# Patient Record
Sex: Male | Born: 1935 | Race: White | Hispanic: No | Smoking: Never smoker
Health system: Southern US, Community
[De-identification: ages and names within clinical notes are randomized; demographics above are authoritative.]

## PROBLEM LIST (undated history)

## (undated) DIAGNOSIS — IMO0002 Reserved for concepts with insufficient information to code with codable children: Secondary | ICD-10-CM

## (undated) DIAGNOSIS — M1990 Unspecified osteoarthritis, unspecified site: Secondary | ICD-10-CM

## (undated) DIAGNOSIS — I10 Essential (primary) hypertension: Secondary | ICD-10-CM

## (undated) DIAGNOSIS — C801 Malignant (primary) neoplasm, unspecified: Secondary | ICD-10-CM

## (undated) DIAGNOSIS — H9190 Unspecified hearing loss, unspecified ear: Secondary | ICD-10-CM

## (undated) DIAGNOSIS — M810 Age-related osteoporosis without current pathological fracture: Secondary | ICD-10-CM

## (undated) DIAGNOSIS — R233 Spontaneous ecchymoses: Secondary | ICD-10-CM

## (undated) DIAGNOSIS — R238 Other skin changes: Secondary | ICD-10-CM

## (undated) DIAGNOSIS — R0989 Other specified symptoms and signs involving the circulatory and respiratory systems: Secondary | ICD-10-CM

## (undated) DIAGNOSIS — R609 Edema, unspecified: Secondary | ICD-10-CM

## (undated) HISTORY — PX: BACK SURGERY: SHX140

## (undated) HISTORY — PX: KNEE SURGERY: SHX244

## (undated) HISTORY — DX: Spontaneous ecchymoses: R23.3

## (undated) HISTORY — DX: Reserved for concepts with insufficient information to code with codable children: IMO0002

## (undated) HISTORY — DX: Other specified symptoms and signs involving the circulatory and respiratory systems: R09.89

## (undated) HISTORY — DX: Other skin changes: R23.8

## (undated) HISTORY — PX: OTHER SURGICAL HISTORY: SHX169

## (undated) HISTORY — DX: Unspecified hearing loss, unspecified ear: H91.90

## (undated) HISTORY — DX: Essential (primary) hypertension: I10

## (undated) HISTORY — DX: Edema, unspecified: R60.9

## (undated) HISTORY — PX: APPENDECTOMY: SHX54

## (undated) HISTORY — PX: NECK SURGERY: SHX720

## (undated) HISTORY — DX: Unspecified osteoarthritis, unspecified site: M19.90

## (undated) HISTORY — DX: Malignant (primary) neoplasm, unspecified: C80.1

## (undated) HISTORY — DX: Age-related osteoporosis without current pathological fracture: M81.0

---

## 2006-07-11 ENCOUNTER — Inpatient Hospital Stay (HOSPITAL_COMMUNITY): Admission: RE | Admit: 2006-07-11 | Discharge: 2006-07-16 | Payer: Self-pay | Admitting: Orthopaedic Surgery

## 2006-07-13 ENCOUNTER — Ambulatory Visit: Payer: Self-pay | Admitting: Physical Medicine & Rehabilitation

## 2007-01-25 ENCOUNTER — Encounter: Admission: RE | Admit: 2007-01-25 | Discharge: 2007-01-25 | Payer: Self-pay | Admitting: Orthopaedic Surgery

## 2007-06-04 ENCOUNTER — Encounter: Admission: RE | Admit: 2007-06-04 | Discharge: 2007-06-04 | Payer: Self-pay | Admitting: Orthopaedic Surgery

## 2007-11-05 IMAGING — CR DG LUMBAR SPINE 2-3V
1 series · 1 of 1 positions shown · non-contrast
Comparison: none

CLINICAL DATA: L2-L5 fusion.  
 LUMBAR SPINE ? 3 VIEW:

[view not recorded]
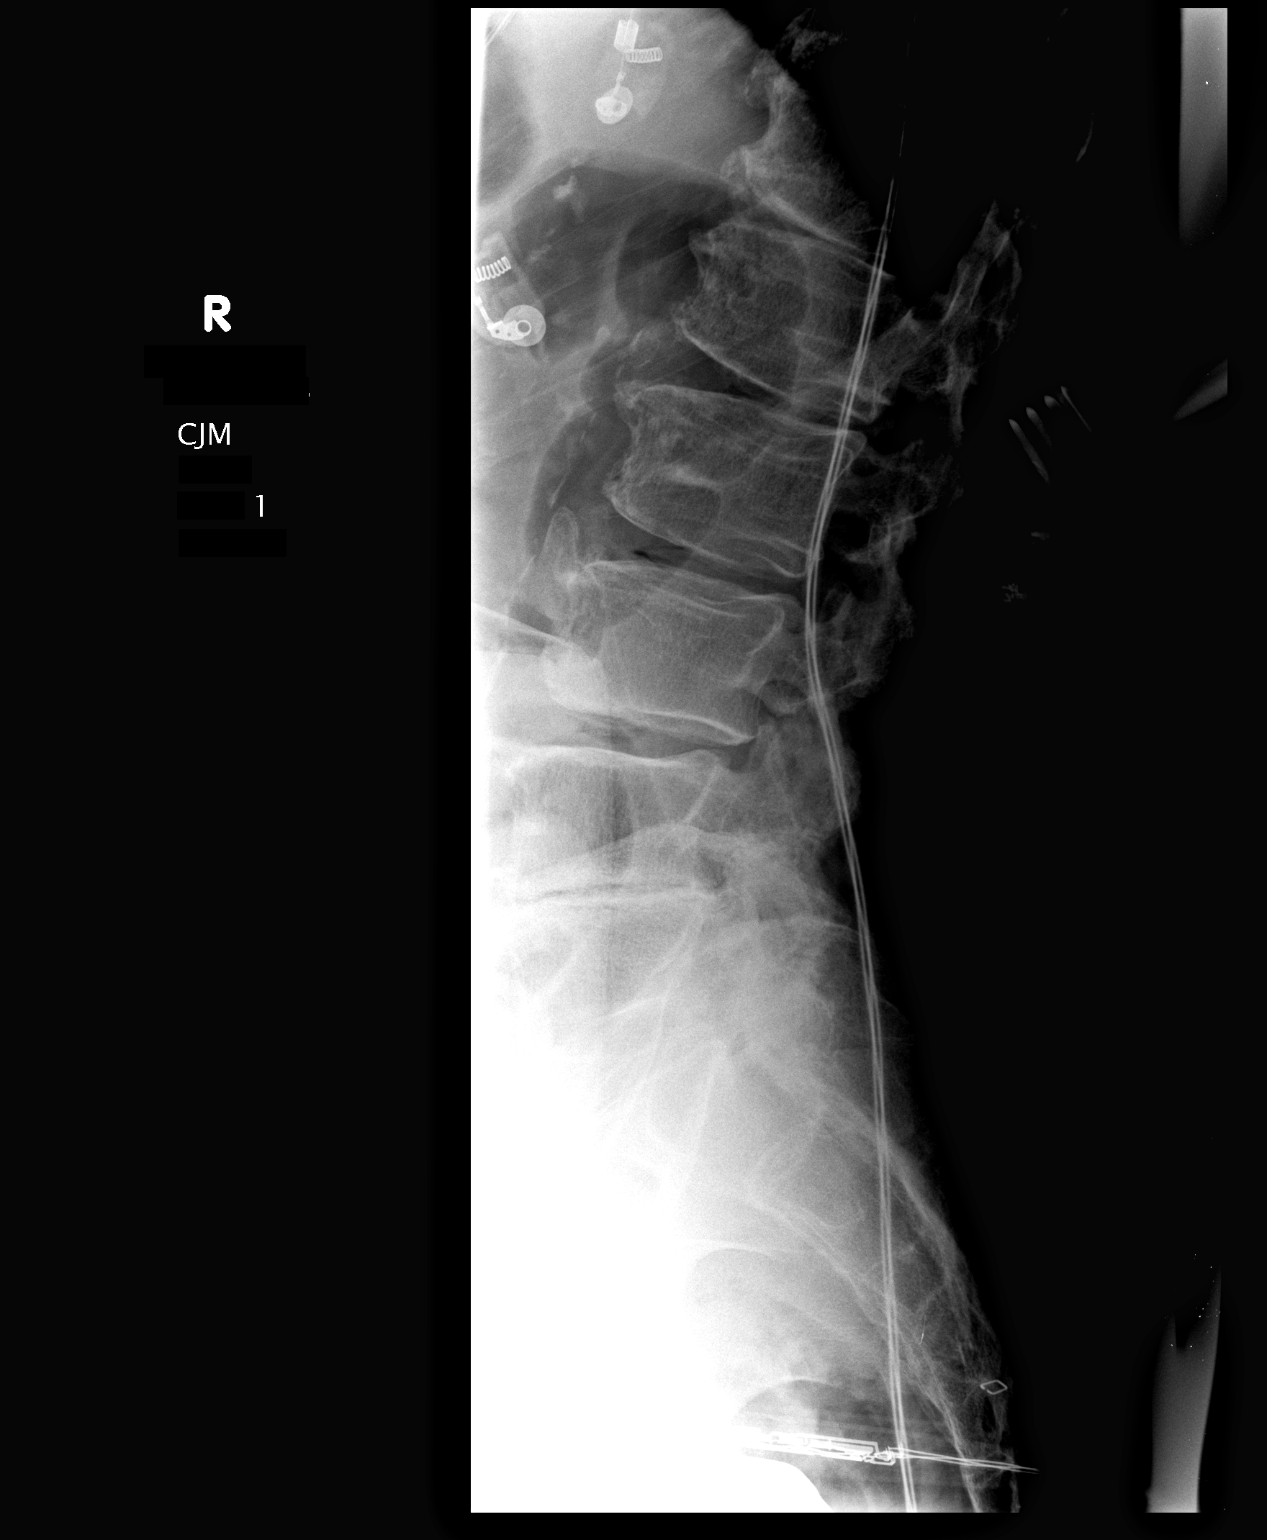

[1 of 1 positions shown; findings below may reference images not displayed]

FINDINGS: Film #1 at 6377 hours demonstrates spreaders posteriorly in the region of L1 and L2.  
 Film #2 at 4440 hours demonstrates spreaders posteriorly in the wound from L2 to L5.  There is a probe directed much closer toward the L2 vertebra. 
 Film #3 at 2522 hours demonstrates a pedicle screw and rod fixation from L2 through L5.  Alignment appears grossly satisfactory.
IMPRESSION: L1 and L2 identified.    L2 marked.    L2-L5 fusion.

## 2008-10-15 ENCOUNTER — Ambulatory Visit (HOSPITAL_COMMUNITY): Admission: RE | Admit: 2008-10-15 | Discharge: 2008-10-15 | Payer: Self-pay | Admitting: Anesthesiology

## 2010-02-09 IMAGING — CR DG SHOULDER 2+V*L*
4 series · 4 of 4 positions shown · non-contrast
Comparison: None

CLINICAL DATA: Left shoulder pain

LEFT SHOULDER - 2+ VIEW

[w shoulder ap external left]
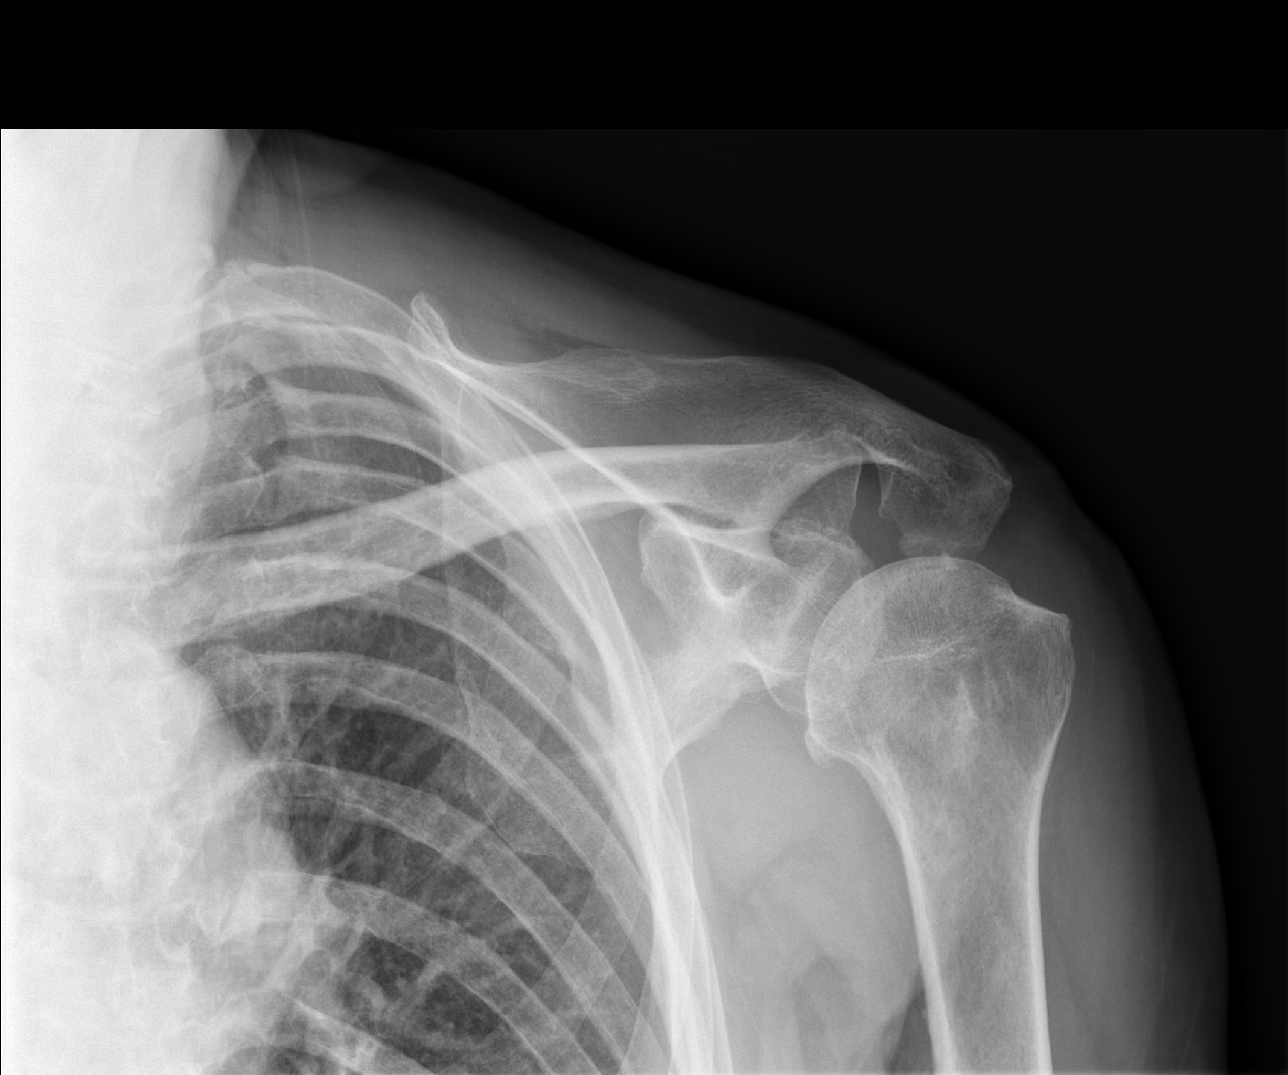

[w shoulder ap internal left]
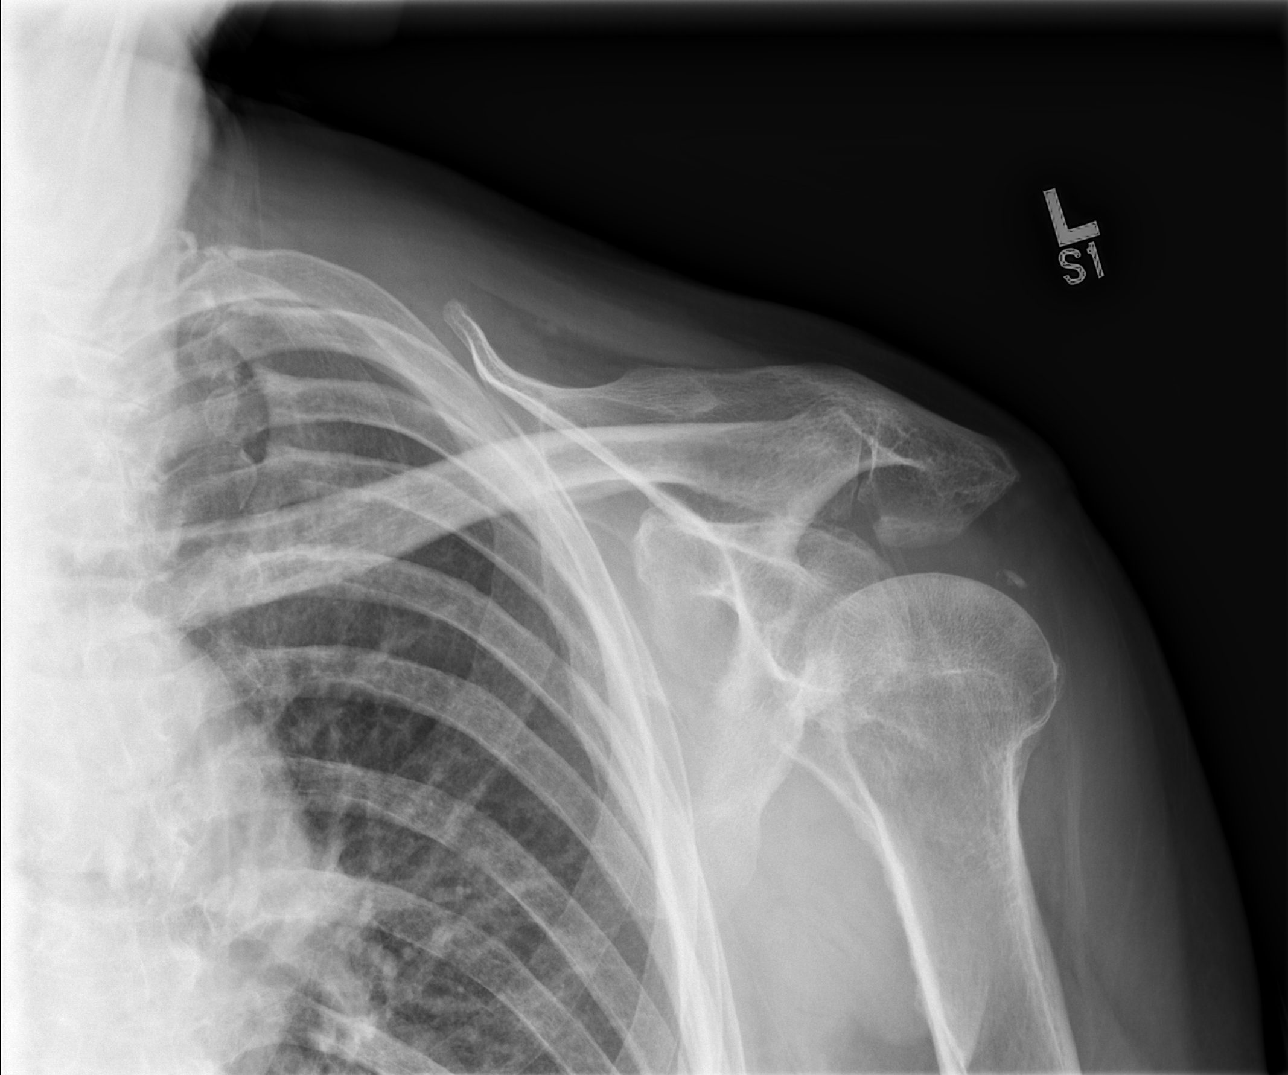

[w shoulder y view left (1 of 2)]
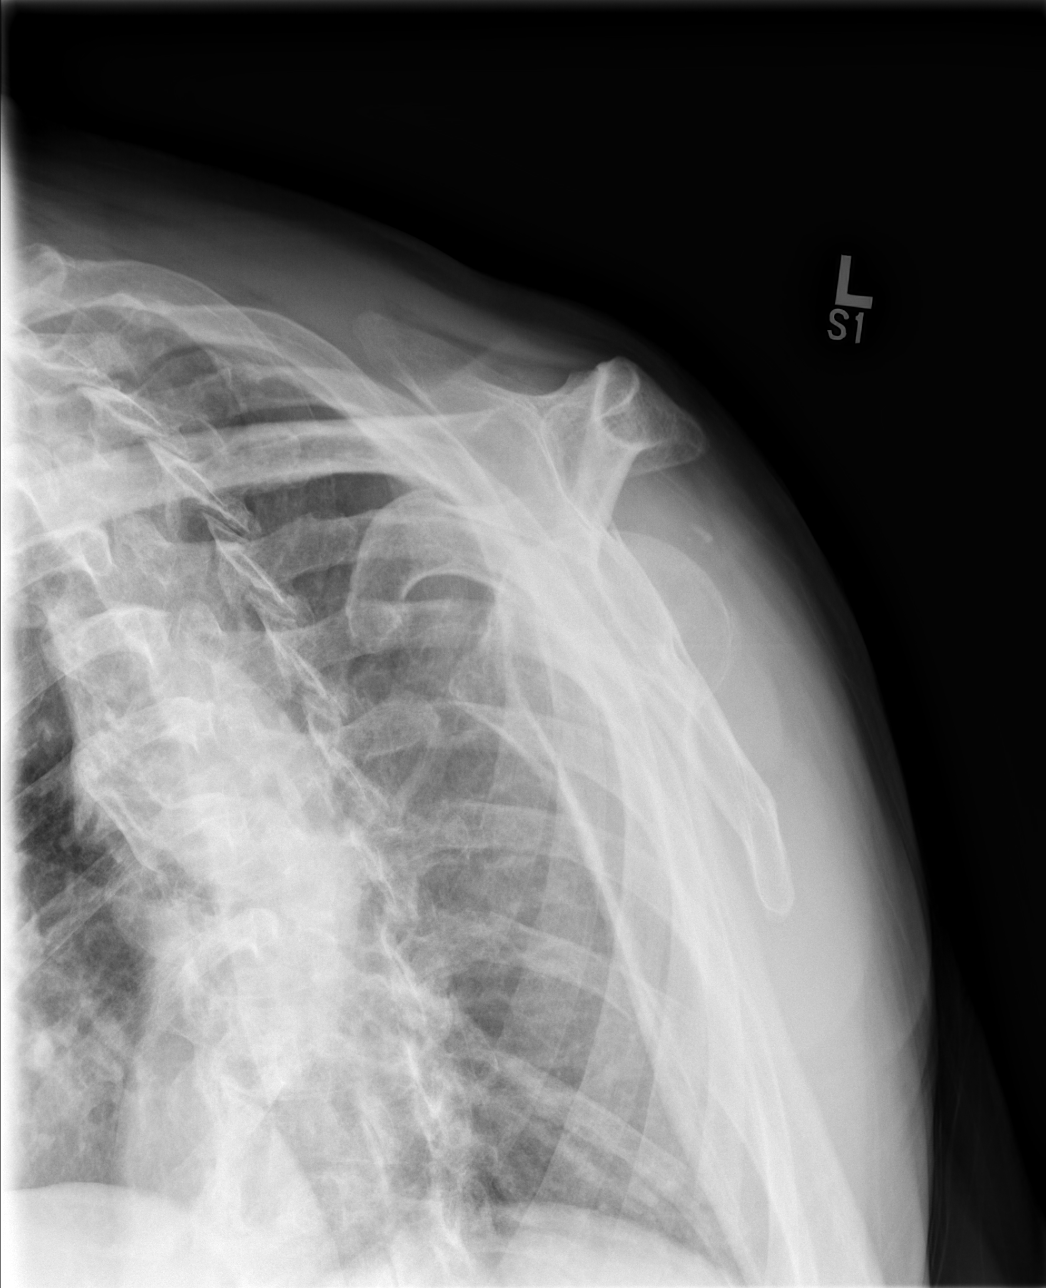

[w shoulder y view left (2 of 2)]
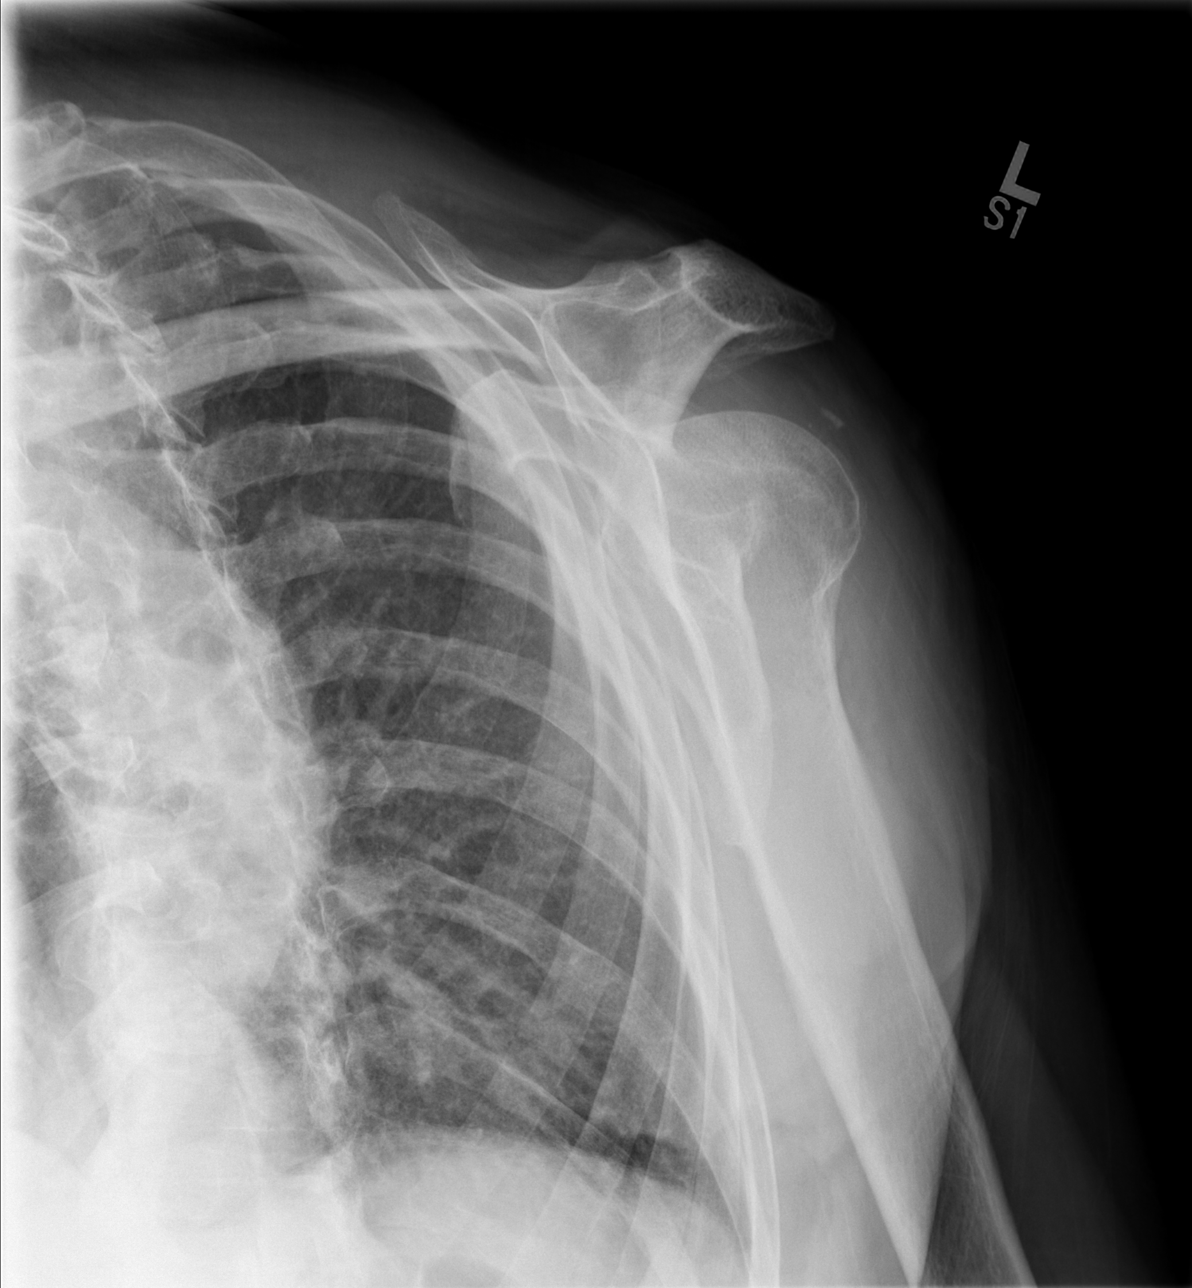

[4 of 4 positions shown; findings below may reference images not displayed]

FINDINGS: Exaggerated anterior angulation of the shoulder during
imaging may be due to thoracic kyphosis. Glenohumeral spurring is
present with osteophyte both along the bony glenoid and humeral
head.

The internal rotation view demonstrates a calcific density
projecting over the rotator cuff possibly representing calcific
tendinopathy.  This is in the vicinity of the infraspinatus.

No dislocation or fracture identified.  The acromial undersurface
thought to be type 2 (curved). Remote, healed left fifth rib
fracture noted.
IMPRESSION: 1.  Degenerative glenohumeral arthropathy.
2.  Suspected calcific tendinopathy of the infraspinatus.
3.  Exaggerated anterior angulation of the scapula on the frontal
projections is likely due to thoracic kyphosis.

## 2011-02-17 NOTE — Op Note (Signed)
NAMEJALAN, FARISS NO.:  [id number]   MEDICAL RECORD NO.:  [id number]          PATIENT TYPE:  INP   LOCATION:  1610                         FACILITY:  [facility]   PHYSICIAN:  Sharolyn Douglas, M.D.        DATE OF BIRTH:  [birth year]   DATE OF PROCEDURE:  07/11/2006  DATE OF DISCHARGE:                                 OPERATIVE REPORT   DIAGNOSES:  1. Lumbar spinal stenosis, recurrent.  2. Post laminectomy syndrome.  3. Retrolisthesis L3-4.  4. Lumbar spondylosis and degenerative disk disease.   PROCEDURES:  1. Revision lumbar laminectomy L2-L5 with wide decompression of the thecal      sac and L2, L3, L4 and L5 nerve roots bilaterally.  2. Transforaminal lumbar interbody fusion L3-4 on the right side and at L4-      5 on the left side with placement of two PEEK cages.  3. Posterior spinal fusion from L2-L5.  4. Segmental pedicle screw instrumentation L2-L5 using the Abbott spine      system.  5. Local autogenous bone graft supplemented with bone morphogenic protein      and 15 mL Grafton allograft.   SURGEON:  Sharolyn Douglas, MD   ASSISTANT:  Verlin Fester, P.A.   ANESTHESIA:  General endotracheal.   ESTIMATED BLOOD LOSS:  500 mL.   COMPLICATIONS:  None.  Needle and sponge count correct.   INDICATIONS:  The patient is a pleasant 77 year old male with chronic  progressive back and bilateral lower extremity pain.  He has had a previous  multilevel lumbar laminectomy without any improvement.  His imaging studies  show severe persistent spinal stenosis in the foramen and lateral recess at  L2-3, L3-4 and L4-5.  He has a retrolisthesis of L3-L4, severe disk space  narrowing at L4-5.  He has failed to respond to other conservative treatment  modalities and due to his severe intractable pain elects to undergo  multilevel lumbar decompression and fusion in hopes of improving his  symptoms.  The risks, benefits, and alternatives were reviewed with him and  his family and  they have elected to proceed.   PROCEDURE:  After informed consent, the patient was taken to the operating  room.  He underwent general endotracheal anesthesia without difficulty and  given prophylactic IV antibiotics.  Neuro monitoring was established in the  form of lower extremity EMG's and SSEPs.  He was carefully turned prone onto  the [employer] four-poster positioning frame.  All bony prominences padded.  The face and eyes protected at all times.  The back was prepped and draped  in the usual sterile fashion.  His previous midline incision was utilized.   Dissection was carried through the scar tissue and the residual L2 spinous  process was identified.  Using meticulous dissection a subperiosteal  exposure was carried out to the tips of the transverse process of L2, L3, L4  and L5 bilaterally.  Great care was taken not to enter the spinal canal due  to the previous laminectomy.  Intraoperative x-ray was taken to confirm the  levels.  We then placed deep retractors.  We turned our attention to  completing revision laminectomy.  This was very tedious due to the  tremendous scarring which had occurred.  Loupes and headlight magnification  along with curettes were used to dissect the edges of the previous  laminectomy, exposing the underlying thecal sac.   Once this was accomplished a high-speed bur along with a Leksell rongeur and  Kerrison punches were used to widen the laminectomy and extend it up across  the L2-3 segment.  We found severe spinal stenosis and the lateral recess at  each level.  At L3-4 there was a large disk rupture on the right side which  was severely compressing the L3 and L4 nerve roots.  This had become  adherent to the thecal sac.  In order to mobilize the nerve roots we  encountered a small dural rent and this was repaired tightly using two 4-0  Nurolon sutures.  Valsalva showed no leak.   Once we were satisfied with the decompression identifying each nerve  root  including the L2, L3-L4 and L5 bilaterally we turned our attention to  placing pedicle screws and completing the posterior arthrodesis from L2-L5.  Each pedicle was started using the awl.  The pedicle probe was then used to  cannulate the pedicles.  We palpated the pedicles with a ball-tip feeler and  could also palpate from within the spinal canal and there were no breeches.  We placed 6.5 x 15 mm screws at L2, L3 and L4.  We utilized 6.5 x 45 mm  screws in L5.  The screw purchase was adequate.  Each screw was stimulated  using triggered EMG's.  There were no deleterious changes.  Before placing  each screw the transverse process was decorticated using a high-speed bur.  Local bone graft obtained from the laminectomy which had been cleaned and  morselized was packed from the L2-L5 transverse processes at this was  supplemented with 15 mL of Grafton allograft.   At this point we turned our attention to completing transforaminal lumbar  interbody fusions on the right at L3-4 and on the left at L4-5 to further  decompress the foramen by indirect distraction.  Starting at L3-4 the disk  space was entered.  On this side there was a large disk rupture and this was  decompressed back into the interspace and removed with a pituitary.  We then  scraped the cartilaginous endplates with curved curettes across the  contralateral side.  The disk space was dilated up to 9 mm.  We packed the  disk space with BMP sponges and local bone graft.  A 9-mm PEEK cage had been  packed with a BMP sponge and was inserted into the interspace, tamped  anteriorly and across the midline.  This allowed for partial reduction of  the retrolisthesis.  We then performed a similar operation at L4-5 on the  left.  In this case we were able to dilate the disk space up to 7 mm.  We  again placed the PEEK cage measuring 7 mm in height packed with the BMP sponge.  After the TLIF procedures there were no deleterious changes  in the  free running EMG's which were monitored throughout.  We can now easily pass  our probe out the L3-4 and L4-5 foramen bilaterally.  Hemostasis was  achieved.  We placed titanium rods which were bent into lordosis into the  polyaxial screw heads.  The titanium locking caps were applied and  sheared  off.  Hemostasis was again achieved and the wound was irrigated.  A deep  Hemovac drain was left in place.  The deep fascia closed with a running #1  Vicryl suture.  Subcutaneous layer closed with 2-0 Vicryl followed by a  running 3-0 nylon suture on the skin edges.  A sterile dressing applied.  The patient was turned supine, extubated without difficulty and transferred  to recovery in stable condition able to move his upper lower extremities.  It should be noted that my assistant [facility], PA was present  throughout the procedure including during the positioning, the exposure, the  decompression, the instrumentation and fusion.  She also assisted with wound  closure.     Sharolyn Douglas, M.D.  Electronically Signed    MC/MEDQ  D:  07/12/2006  T:  07/13/2006  Job:  [id number]

## 2011-02-17 NOTE — Discharge Summary (Signed)
NAMEMANDELL, PANGBORN                 ACCOUNT NO.:  [id number]   MEDICAL RECORD NO.:  [id number]          PATIENT TYPE:  INP   LOCATION:  5031                         FACILITY:  [facility]   PHYSICIAN:  Verlin Fester, P.A.    DATE OF BIRTH:  22-Mar-1919   DATE OF ADMISSION:  07/11/2006  DATE OF DISCHARGE:  07/16/2006                               DISCHARGE SUMMARY   ADMITTING DIAGNOSES:  1. L2 to L5 spinal stenosis and degenerative disk disease.  2. Hypertension.  3. Hypercholesterolemia.   DISCHARGE DIAGNOSES:  1. Status post revision laminectomy and posterior spinal fusion, L2 to      L5.  2. Postoperative blood loss anemia.   PROCEDURE:  On July 11, 2006, the patient was taken to the operating  room for L2 to L5 laminectomy and posterior spinal fusion.   SURGEON:  Sharolyn Douglas, MD.   ASSISTANT:  Mahar, [place], [place].   ANESTHESIA:  General.   CONSULTS:  [facility].   LABORATORY:  CBC with diff preoperatively was within normal limits with  the exception of RDW 15 and eosinophil of 6.  Postoperatively, H&H were  monitored daily and reached a low of 8.6 and 25.2 on July 15, 2006,  increased the following day up to 10.2 and 30.7.  Metabolic panel preop  was normal, except for AST 40 and ALP of 50.  Basic metabolic panel  monitored postoperatively did show an increasing glucose ranging from  164 to 190.  Calcium remained low at 7.8 to 7.9.  One episode decreased  potassium at 3.4 on July 16, 2006.  UA was negative.  Blood typing  was O-positive.  Antibody screen was negative.  Urine cultures showed no  growth.  EKG on October 4th showed sinus bradycardia, with no previous  tracing, read by Charlton Haws, MD.  X-rays of lumbar spine on October  10th were used intraoperatively for localization.   BRIEF HISTORY:  The patient is an 77 year old male who has had  longstanding problems related to his back with decrease in functioning,  increasing pain.  He has tried  conservative treatments and unfortunately  failed.  He was found to have spinal stenosis and severe degenerative  disk disease from L2 to L5.  Because of his failure to improve as well  as significant findings on MRI and x-ray, it was felt that the best  course of management would be a decompression and fusion.  Risks and  benefits of this procedure was discussed with the patient as well as his  family.  He indicated understanding and opted to proceed.   HOSPITAL COURSE:  On October 10, the patient was admitted to the  hospital and taken to the operating room for the above procedure.  He  tolerated the procedure well without any intraoperative complications,  and he was transferred to the recovery room in stable condition.   Postoperatively, routine orthopedic spine protocol was followed, and he  progressed along well.  Physical therapy and occupational therapy did  work with him on a progressive ambulation program and brace using back  precautions.  He was somewhat slow to progress with physical therapy;  therefore, [facility] consult was ordered.  They thought he  may be an appropriate candidate, but in waiting for a bed, he got to the  point that he was independent and safe with his ambulation and brace  using back precautions.  He did have postoperative anemia that got  severe.  He was transfused 2 units of packed red cells.  He tolerated  the transfusion without any apparent complication, and his hemoglobin  increased, as previously dictated in the lab section.  By July 16, 2006, the patient was doing very well.  He was medically stable and  ready for discharge.   DISCHARGE PLAN:  The patient is an 77 year old male who is status post  L2 to L5 laminectomy and fusion, doing well with activities, daily  ambulation program.  Brace on when he is up.  Back precautions at all  times.  No lifting heavier than 5 pounds.  Daily dressing changes until  drainage stops.  Keep  the incision dry until drainage stops.  Follow up  in 2 weeks postoperatively with Dr. Noel Gerold.   DIET:  Regular diet as tolerated.   MEDICATIONS:  1. Vicodin for pain.  2. Robaxin for muscle spasms.  3. Multivitamin daily.  4. Calcium daily.  5. Laxative as needed.   CONDITION ON DISCHARGE:  Stable, improved.   DISPOSITION:  The patient may discharge to his home with his family's  assistance as well as home health, physical therapy, and occupational  therapy.      Verlin Fester, P.A.     CM/MEDQ  D:  09/06/2006  T:  09/07/2006  Job:  [id number]   cc:   Sharolyn Douglas, M.D.

## 2013-11-26 ENCOUNTER — Telehealth: Payer: Self-pay | Admitting: *Deleted

## 2013-11-26 ENCOUNTER — Ambulatory Visit (INDEPENDENT_AMBULATORY_CARE_PROVIDER_SITE_OTHER): Payer: Medicare Other

## 2013-11-26 ENCOUNTER — Ambulatory Visit: Payer: Self-pay

## 2013-11-26 VITALS — BP 131/61 | HR 68 | Resp 18

## 2013-11-26 DIAGNOSIS — Q828 Other specified congenital malformations of skin: Secondary | ICD-10-CM

## 2013-11-26 DIAGNOSIS — M2040 Other hammer toe(s) (acquired), unspecified foot: Secondary | ICD-10-CM

## 2013-11-26 DIAGNOSIS — M79609 Pain in unspecified limb: Secondary | ICD-10-CM

## 2013-11-26 DIAGNOSIS — L97509 Non-pressure chronic ulcer of other part of unspecified foot with unspecified severity: Secondary | ICD-10-CM

## 2013-11-26 NOTE — Progress Notes (Signed)
   Subjective:    Patient ID: Victor Bush, male    DOB: Jun 19, 1919, 80 y.o.   MRN: [id number]  HPI daughter states the 5th toes on both feet and hurts on the inside of the 5th toe and has ben going on for a few weeks and get sore and tender and have not noticed any draining and have used pads and iodine    Review of Systems  Gastrointestinal: Positive for constipation.  Musculoskeletal: Positive for back pain and gait problem.       Joint pain   Skin:       Open sores  Hematological: Bruises/bleeds easily.  All other systems reviewed and are negative.       Objective:   Physical Exam Lower extremity objective findings as follows patient does have pedal pulses palpable DP postal for bilateral PT plus one over 4 bilateral thready Refill timed 3-4 seconds skin temperature warm to cool turgor diminished neurologic epicritic and proprioceptive sensations intact and symmetric bilateral there is normal plantar response DTRs not listed. Neurologically skin color pigment normal hair growth absent does have dry scaly skin is area plaque posse eczematous proximal first MTP joint dorsal left foot patient also has some dry eschar tissue and areas of eczema on his right foot however the interdigital webspace between the fourth and fifth toes right foot shows an erythematous ulceration of the medial surface of the fifth digit proximal the 5 mm in diameter with some surrounding macerated keratoses. There is no apparent discharge or drainage no ascending cellulitis or lymphangitis no secondary signs of infection. Patient's nails are thickened yellow brittle discolored consistent with onychomycosis care not painful her symptomatically at this time no complaints noted       Assessment & Plan:  Assessment this time is ulceration secondary keratoses and hammertoe deformity fifth digit right foot rubbing against the adjacent fourth digit. At this time keratoses is debrided the ulceration debrided cleansed  with all cleansed at this time Silvadene gauze dressing is applied also to foam pads are dispensed to keep the toes separated in the future to dispensed prescription for Silvadene to be applied daily as instructed until resolved followup in 2-3 weeks if it fails to resolve be K. for other alternative measures contact us immediately if any changes or exacerbations or worsening of the lesion. Otherwise reappointed in the future as needed  Harriet Masson DPM

## 2013-11-26 NOTE — Patient Instructions (Signed)
Instructions for Wound Care  The most important step to healing a foot wound is to reduce the pressure on your foot - it is extremely important to stay off your foot as much as possible and wear the shoe/boot as instructed.  Cleanse your foot with saline wash or warm soapy water (dial antibacterial soap or similar).  Blot dry.  Apply prescribed medication to your wound and cover with gauze and a bandage.  May hold bandage in place with Coban (self sticky wrap), Ace bandage or tape.  You may find dressing supplies at your local [employer], [employer], drug store or medical supply store.  Your prescribed topical medication is :  Silvadene Cream (twice daily) actually once daily application is adequate at this time    If you notice any foul odor, increase in pain, pus, increased swelling, red streaks or generalized redness occurring in your foot or leg-Call our office immediately to be seen.  This may be a sign of a limb or life threatening infection that will need prompt attention.  Harriet Masson, [place]  [facility]

## 2013-11-26 NOTE — Telephone Encounter (Signed)
Use the patient's correct we discussed the possible oral antibiotic Call in to the patient's pharmacy;  cephalexin 500 mg, dispensed 30 caps, one by mouth p.c. 3 times a day, no refills  Call to the patient's daughter apologize I forgot to enter that prescription and let him know they can pick that up when it's available.

## 2013-12-12 NOTE — Telephone Encounter (Signed)
Called pt daughter and apologized for the error antibiotic and cream are called in by nurse.

## 2013-12-31 DEATH — deceased
